# Patient Record
Sex: Male | Born: 1965 | Race: White | Hispanic: No | Marital: Married | Smoking: Current every day smoker
Health system: Southern US, Community
[De-identification: ages and names within clinical notes are randomized; demographics above are authoritative.]

## PROBLEM LIST (undated history)

## (undated) DIAGNOSIS — I10 Essential (primary) hypertension: Secondary | ICD-10-CM

## (undated) DIAGNOSIS — N200 Calculus of kidney: Secondary | ICD-10-CM

---

## 1998-06-19 ENCOUNTER — Encounter: Payer: Self-pay | Admitting: Emergency Medicine

## 1998-06-19 ENCOUNTER — Emergency Department (HOSPITAL_COMMUNITY): Admission: EM | Admit: 1998-06-19 | Discharge: 1998-06-19 | Payer: Self-pay | Admitting: Emergency Medicine

## 2005-09-21 ENCOUNTER — Emergency Department (HOSPITAL_COMMUNITY): Admission: EM | Admit: 2005-09-21 | Discharge: 2005-09-22 | Payer: Self-pay | Admitting: Emergency Medicine

## 2005-12-21 ENCOUNTER — Encounter: Admission: RE | Admit: 2005-12-21 | Discharge: 2005-12-21 | Payer: Self-pay | Admitting: Internal Medicine

## 2008-02-03 ENCOUNTER — Emergency Department (HOSPITAL_COMMUNITY): Admission: EM | Admit: 2008-02-03 | Discharge: 2008-02-03 | Payer: Self-pay | Admitting: Family Medicine

## 2010-10-12 ENCOUNTER — Encounter (HOSPITAL_COMMUNITY): Payer: Self-pay | Admitting: Radiology

## 2010-10-12 ENCOUNTER — Emergency Department (HOSPITAL_COMMUNITY): Payer: Self-pay

## 2010-10-12 ENCOUNTER — Emergency Department (HOSPITAL_COMMUNITY)
Admission: EM | Admit: 2010-10-12 | Discharge: 2010-10-12 | Disposition: A | Discharge status: Home / Self Care | Payer: Self-pay | Attending: Emergency Medicine | Admitting: Emergency Medicine

## 2010-10-12 DIAGNOSIS — R1013 Epigastric pain: Secondary | ICD-10-CM | POA: Insufficient documentation

## 2010-10-12 DIAGNOSIS — N1330 Unspecified hydronephrosis: Secondary | ICD-10-CM | POA: Insufficient documentation

## 2010-10-12 DIAGNOSIS — R109 Unspecified abdominal pain: Secondary | ICD-10-CM | POA: Insufficient documentation

## 2010-10-12 DIAGNOSIS — I10 Essential (primary) hypertension: Secondary | ICD-10-CM | POA: Insufficient documentation

## 2010-10-12 DIAGNOSIS — N201 Calculus of ureter: Secondary | ICD-10-CM | POA: Insufficient documentation

## 2010-10-12 DIAGNOSIS — R319 Hematuria, unspecified: Secondary | ICD-10-CM | POA: Insufficient documentation

## 2010-10-12 DIAGNOSIS — R300 Dysuria: Secondary | ICD-10-CM | POA: Insufficient documentation

## 2010-10-12 HISTORY — DX: Essential (primary) hypertension: I10

## 2010-10-12 LAB — URINE MICROSCOPIC-ADD ON

## 2010-10-12 LAB — URINALYSIS, ROUTINE W REFLEX MICROSCOPIC
Bilirubin Urine: NEGATIVE
Glucose, UA: NEGATIVE mg/dL
Ketones, ur: NEGATIVE mg/dL
Nitrite: NEGATIVE
Protein, ur: 30 mg/dL — AB
Specific Gravity, Urine: 1.023 (ref 1.005–1.030)
Urobilinogen, UA: 0.2 mg/dL (ref 0.0–1.0)
pH: 6 (ref 5.0–8.0)

## 2010-10-12 LAB — DIFFERENTIAL
Basophils Absolute: 0.1 10*3/uL (ref 0.0–0.1)
Eosinophils Absolute: 0.2 10*3/uL (ref 0.0–0.7)
Lymphocytes Relative: 26 % (ref 12–46)
Lymphs Abs: 3.2 10*3/uL (ref 0.7–4.0)
Neutrophils Relative %: 63 % (ref 43–77)

## 2010-10-12 LAB — CBC
HCT: 46.9 % (ref 39.0–52.0)
MCV: 89.3 fL (ref 78.0–100.0)
Platelets: 287 10*3/uL (ref 150–400)
RBC: 5.25 MIL/uL (ref 4.22–5.81)
RDW: 12.6 % (ref 11.5–15.5)
WBC: 12.2 10*3/uL — ABNORMAL HIGH (ref 4.0–10.5)

## 2010-10-12 LAB — COMPREHENSIVE METABOLIC PANEL
ALT: 24 U/L (ref 0–53)
AST: 20 U/L (ref 0–37)
Albumin: 4.2 g/dL (ref 3.5–5.2)
Alkaline Phosphatase: 93 U/L (ref 39–117)
BUN: 13 mg/dL (ref 6–23)
Chloride: 102 mEq/L (ref 96–112)
Potassium: 3.4 mEq/L — ABNORMAL LOW (ref 3.5–5.1)
Sodium: 140 mEq/L (ref 135–145)
Total Bilirubin: 0.4 mg/dL (ref 0.3–1.2)
Total Protein: 7.6 g/dL (ref 6.0–8.3)

## 2010-10-12 MED ORDER — IOHEXOL 300 MG/ML  SOLN
100.0000 mL | Freq: Once | INTRAMUSCULAR | Status: AC | PRN
  Administered 2010-10-12: 100 mL via INTRAVENOUS

## 2010-10-29 ENCOUNTER — Other Ambulatory Visit: Payer: Self-pay | Admitting: Urology

## 2010-10-29 ENCOUNTER — Encounter (HOSPITAL_COMMUNITY): Payer: BC Managed Care – PPO

## 2010-10-29 ENCOUNTER — Ambulatory Visit (HOSPITAL_COMMUNITY)
Admission: RE | Admit: 2010-10-29 | Discharge: 2010-10-29 | Disposition: A | Discharge status: Home / Self Care | Payer: BC Managed Care – PPO | Source: Ambulatory Visit | Attending: Urology | Admitting: Urology

## 2010-10-29 ENCOUNTER — Other Ambulatory Visit (HOSPITAL_COMMUNITY): Payer: Self-pay | Admitting: Urology

## 2010-10-29 DIAGNOSIS — Z01810 Encounter for preprocedural cardiovascular examination: Secondary | ICD-10-CM | POA: Insufficient documentation

## 2010-10-29 DIAGNOSIS — N200 Calculus of kidney: Secondary | ICD-10-CM | POA: Insufficient documentation

## 2010-10-29 DIAGNOSIS — I10 Essential (primary) hypertension: Secondary | ICD-10-CM

## 2010-10-29 DIAGNOSIS — Z01811 Encounter for preprocedural respiratory examination: Secondary | ICD-10-CM

## 2010-10-29 DIAGNOSIS — Z87891 Personal history of nicotine dependence: Secondary | ICD-10-CM | POA: Insufficient documentation

## 2010-10-29 DIAGNOSIS — Z01818 Encounter for other preprocedural examination: Secondary | ICD-10-CM | POA: Insufficient documentation

## 2010-10-29 DIAGNOSIS — Z01812 Encounter for preprocedural laboratory examination: Secondary | ICD-10-CM | POA: Insufficient documentation

## 2010-10-29 LAB — BASIC METABOLIC PANEL
BUN: 17 mg/dL (ref 6–23)
CO2: 31 mEq/L (ref 19–32)
Calcium: 10.1 mg/dL (ref 8.4–10.5)
Creatinine, Ser: 0.89 mg/dL (ref 0.4–1.5)
GFR calc non Af Amer: >60 mL/min (ref >60)
Glucose, Bld: 102 mg/dL — ABNORMAL HIGH (ref 70–99)
Sodium: 138 mEq/L (ref 135–145)

## 2010-10-29 LAB — SURGICAL PCR SCREEN: Staphylococcus aureus: INVALID — AB

## 2010-11-01 ENCOUNTER — Ambulatory Visit (HOSPITAL_COMMUNITY)
Admission: RE | Admit: 2010-11-01 | Discharge: 2010-11-01 | Disposition: A | Discharge status: Home / Self Care | Payer: BC Managed Care – PPO | Source: Ambulatory Visit | Attending: Urology | Admitting: Urology

## 2010-11-01 DIAGNOSIS — Z79899 Other long term (current) drug therapy: Secondary | ICD-10-CM | POA: Insufficient documentation

## 2010-11-01 DIAGNOSIS — N201 Calculus of ureter: Secondary | ICD-10-CM | POA: Insufficient documentation

## 2010-11-01 DIAGNOSIS — I10 Essential (primary) hypertension: Secondary | ICD-10-CM | POA: Insufficient documentation

## 2010-11-01 DIAGNOSIS — G510 Bell's palsy: Secondary | ICD-10-CM | POA: Insufficient documentation

## 2010-11-01 LAB — MRSA CULTURE

## 2010-11-08 ENCOUNTER — Other Ambulatory Visit: Payer: Self-pay | Admitting: Urology

## 2010-11-08 ENCOUNTER — Encounter (HOSPITAL_COMMUNITY): Payer: BC Managed Care – PPO

## 2010-11-08 LAB — BASIC METABOLIC PANEL
CO2: 29 mEq/L (ref 19–32)
Chloride: 100 mEq/L (ref 96–112)
Creatinine, Ser: 0.98 mg/dL (ref 0.50–1.35)
GFR calc Af Amer: >60 mL/min (ref >60)
Potassium: 4.3 mEq/L (ref 3.5–5.1)
Sodium: 139 mEq/L (ref 135–145)

## 2010-11-11 ENCOUNTER — Ambulatory Visit (HOSPITAL_COMMUNITY)
Admission: RE | Admit: 2010-11-11 | Discharge: 2010-11-11 | Disposition: A | Discharge status: Home / Self Care | Payer: BC Managed Care – PPO | Source: Ambulatory Visit | Attending: Urology | Admitting: Urology

## 2010-11-11 DIAGNOSIS — F17200 Nicotine dependence, unspecified, uncomplicated: Secondary | ICD-10-CM | POA: Insufficient documentation

## 2010-11-11 DIAGNOSIS — Z01812 Encounter for preprocedural laboratory examination: Secondary | ICD-10-CM | POA: Insufficient documentation

## 2010-11-11 DIAGNOSIS — I10 Essential (primary) hypertension: Secondary | ICD-10-CM | POA: Insufficient documentation

## 2010-11-11 DIAGNOSIS — N201 Calculus of ureter: Secondary | ICD-10-CM | POA: Insufficient documentation

## 2010-11-11 NOTE — Op Note (Signed)
NAMEMarland Tran  Tran, Richard NO.:  [id number]  MEDICAL RECORD NO.:  [id number]  LOCATION:  DAYL                         FACILITY:  [facility]  PHYSICIAN:  Heloise Purpura, MD      DATE OF BIRTH:  10-20-1965  DATE OF PROCEDURE:  11/01/2010 DATE OF DISCHARGE:  11/01/2010                              OPERATIVE REPORT   PREOPERATIVE DIAGNOSIS:  Right ureteral calculus.  POSTOPERATIVE DIAGNOSIS:  Right ureteral calculus.  PROCEDURES: 1. Cystoscopy. 2. Dilation of urethra. 3. Right retrograde pyelography. 4. Right ureteroscopy. 5. Right ureteral stent placement (6 x 24).  SURGEON:  Heloise Purpura, MD  ANESTHESIA:  General.  COMPLICATIONS:  None.  ESTIMATED BLOOD LOSS:  Minimal.  INDICATIONS:  Mr. [name] is a 45 year old gentleman who was found to have a proximal right ureteral calculus.  After a trial of medical expulsion therapy which was unsuccessful, it was recommended that he undergo definitive surgical treatment of his obstructing right ureteropelvic junction calculus.  His stone was poorly visualized on plain radiograph imaging, thereby making extracorporeal shock wave lithotripsy a poor treatment choice.  We therefore discussed other options and he elected to proceed with ureteroscopic removal and possible laser lithotripsy.  The potential risks, complications, and alternative treatment options of this plan were discussed with the patient and informed consent obtained.  DESCRIPTION OF PROCEDURE:  The patient was taken to the operating room and a general anesthetic was administered.  He was given preoperative antibiotics, placed in the dorsal lithotomy position, and prepped and draped in the usual sterile fashion.  Next a preoperative time-out was performed.  Cystourethroscopy was then attempted but the patient was noted to have narrowing of his distal urethra related to the distal shaft hypospadias.  Sissy Hoff sounds were used to dilate his urethra from 12  to 37 [place].  A 22-French cystoscope sheath was then able to be passed without difficulty and the urethra was examined under direct vision and otherwise appeared unremarkable.  The bladder was then systematically examined with no bladder tumors, stones, or other mucosal pathology identified.  The ureteral orifices were located in their normal expected anatomic locations.  The right ureteral orifice was then cannulated with a 6-French ureteral catheter and Omnipaque contrast was injected.  This demonstrated no obvious filling defects within the distal ureter and a normal caliber ureter up to the proximal ureter.  To ensure no distal stone prior to proceeding with flexible ureteroscopy, a 0.038 sensor guidewire was placed and a semi-rigid 6-French ureteroscope was advanced next to the wire into the distal ureter.  The ureter was able to be examined up to and just above the level of the iliac vessels with no stone identified.  It was felt at this point that the patient's stone was still probably located at the ureteropelvic junction.  An attempt was made to pass a 12/14 ureteral access sheath over the guide wire into the proximal ureter below the suspected level of the stone. However, the access sheath could not be passed easily past level of the iliac vessels.  Therefore the access sheath was removed after a second 0.038 glidewire was passed up into the renal pelvis again under fluoroscopic guidance.  The  7-French Storz flexible ureteroscope was then passed over the glidewire but was unable to be passed up further than the mid proximal ureter.  After multiple attempts, it was decided to place a ureteral stent for passive ureteral dilation.  Therefore the flexible ureteroscope was removed and the wire was back loaded on the cystoscope.  A 6 x 24 double-J ureteral stent was then advanced over the wire using Seldinger technique and positioned appropriately under fluoroscopic and cystoscopic  guidance.  The wire was removed with good curl noted in the renal pelvis as well as in the bladder.  The patient's bladder was then emptied.  He tolerated the procedure well and without complications.  He was able to be extubated and transferred to recovery unit in satisfactory condition.     Heloise Purpura, MD     LB/MEDQ  D:  11/01/2010  T:  11/01/2010  Job:  [id number]  Electronically Signed by Heloise Purpura MD on 11/11/2010 11:33:47 PM

## 2010-11-11 NOTE — Op Note (Signed)
NAME:  MARSEL, [name] NO.:  [id number]  MEDICAL RECORD NO.:  [id number]  LOCATION:  DAYL                         FACILITY:  [facility]  PHYSICIAN:  Heloise Purpura, MD      DATE OF BIRTH:  [birth year]  DATE OF PROCEDURE:  11/11/2010 DATE OF DISCHARGE:                              OPERATIVE REPORT   PREOPERATIVE DIAGNOSIS:  Right ureteral calculus.  POSTOPERATIVE DIAGNOSIS:  Right ureteral calculus.  PROCEDURES: 1. Cystoscopy. 2. Right ureteroscopy with stone removal. 3. Right ureteral stent placement.  SURGEON:  Heloise Purpura, MD  ANESTHESIA:  General.  COMPLICATIONS:  None.  INDICATIONS:  [name] is a 45 year old gentleman who presented with the right ureteral calculus and failed a trial of medical expulsion therapy.  He then underwent attempted ureteroscopic removal but his stone was unable to be removed at that time due to an inability to place the flexible ureteroscope through the ureter due to ureteral size. Therefore, ureteral stent was left indwelling for passive ureteral dilation.  He presents today for further evaluation and definitive treatment of the stone.  The potential risks, complications, and alternative treatment options have been discussed in detail and informed consent obtained.  DESCRIPTION OF PROCEDURE:  The patient was taken to the operating room and a general anesthetic was administered.  He was given preoperative antibiotics, placed in the dorsal lithotomy position, and prepped and draped in the usual sterile fashion.  Next preoperative time-out was performed.  Cystourethroscopy was then attempted to be performed, however, the patient was noted to have a hypospadiac meatus distally. He therefore underwent urethral dilation with Sissy Hoff sounds from 18- [place] up to 24-French.  Cystourethroscopy was then performed which revealed a normal anterior and posterior urethra.  Inspection of the bladder revealed an indwelling right  ureteral stent which was brought out through the urethral meatus with the aid of the flexible graspers. A 0.038 sensor guidewire was then advanced up the stent into the right renal pelvis under fluoroscopic guidance.  The stent was then removed. A semirigid ureteroscope was then advanced next to the guidewire in the distal half of the ureter with no stone seen.  It was therefore felt that it would be safe to place a ureteral access sheath up to this point.  A 12/14 ACMI sheath was then placed over the wire up into the mid proximal ureter which had already been examined and found to be stone free.  Using the digital flexible ureteroscope, this was then passed through the access sheath and into the proximal ureter.  The patient's stone was identified and was able to be removed with a nitinol basket.  Further inspection of the proximal ureter and the renal collecting system revealed no further stones.  A 0.38 sensor guidewire was then advanced back to the ureteroscope into the right renal pelvis and the ureteroscope and access sheath were removed.  The guidewire was then back loaded on the cystoscope and a 6 x 24 double-J ureteral stent with a string tether was placed over the wire and positioned appropriately under fluoroscopic and cystoscopic guidance.  The wire was removed with a good curl noted in the renal pelvis as well as in the  bladder.  The patient's bladder was emptied. He tolerated the procedure well and without complications.  He was able to be awakened and transferred to recovery unit in satisfactory condition.     Heloise Purpura, MD     LB/MEDQ  D:  11/11/2010  T:  11/11/2010  Job:  [id number]  Electronically Signed by Heloise Purpura MD on 11/11/2010 11:33:56 PM

## 2011-02-23 LAB — POCT URINALYSIS DIP (DEVICE)
Glucose, UA: NEGATIVE
Nitrite: NEGATIVE
Urobilinogen, UA: 0.2

## 2011-02-23 LAB — GC/CHLAMYDIA PROBE AMP, GENITAL
Chlamydia, DNA Probe: NEGATIVE
GC Probe Amp, Genital: NEGATIVE

## 2012-06-20 ENCOUNTER — Encounter (HOSPITAL_COMMUNITY): Payer: Self-pay | Admitting: Emergency Medicine

## 2012-06-20 ENCOUNTER — Other Ambulatory Visit: Payer: Self-pay

## 2012-06-20 ENCOUNTER — Emergency Department (HOSPITAL_COMMUNITY): Payer: Self-pay

## 2012-06-20 ENCOUNTER — Emergency Department (HOSPITAL_COMMUNITY)
Admission: EM | Admit: 2012-06-20 | Discharge: 2012-06-20 | Disposition: A | Discharge status: Home / Self Care | Payer: Self-pay | Attending: Emergency Medicine | Admitting: Emergency Medicine

## 2012-06-20 DIAGNOSIS — R509 Fever, unspecified: Secondary | ICD-10-CM | POA: Insufficient documentation

## 2012-06-20 DIAGNOSIS — R05 Cough: Secondary | ICD-10-CM | POA: Insufficient documentation

## 2012-06-20 DIAGNOSIS — Z87442 Personal history of urinary calculi: Secondary | ICD-10-CM | POA: Insufficient documentation

## 2012-06-20 DIAGNOSIS — R52 Pain, unspecified: Secondary | ICD-10-CM | POA: Insufficient documentation

## 2012-06-20 DIAGNOSIS — R5381 Other malaise: Secondary | ICD-10-CM | POA: Insufficient documentation

## 2012-06-20 DIAGNOSIS — R6889 Other general symptoms and signs: Secondary | ICD-10-CM | POA: Insufficient documentation

## 2012-06-20 DIAGNOSIS — R5383 Other fatigue: Secondary | ICD-10-CM | POA: Insufficient documentation

## 2012-06-20 DIAGNOSIS — J159 Unspecified bacterial pneumonia: Secondary | ICD-10-CM | POA: Insufficient documentation

## 2012-06-20 DIAGNOSIS — F17200 Nicotine dependence, unspecified, uncomplicated: Secondary | ICD-10-CM | POA: Insufficient documentation

## 2012-06-20 DIAGNOSIS — I10 Essential (primary) hypertension: Secondary | ICD-10-CM | POA: Insufficient documentation

## 2012-06-20 DIAGNOSIS — R059 Cough, unspecified: Secondary | ICD-10-CM | POA: Insufficient documentation

## 2012-06-20 DIAGNOSIS — J3489 Other specified disorders of nose and nasal sinuses: Secondary | ICD-10-CM | POA: Insufficient documentation

## 2012-06-20 DIAGNOSIS — J189 Pneumonia, unspecified organism: Secondary | ICD-10-CM

## 2012-06-20 HISTORY — DX: Calculus of kidney: N20.0

## 2012-06-20 MED ORDER — ALBUTEROL SULFATE HFA 108 (90 BASE) MCG/ACT IN AERS
2.0000 | INHALATION_SPRAY | RESPIRATORY_TRACT | Status: AC
  Administered 2012-06-20: 2 via RESPIRATORY_TRACT
  Filled 2012-06-20: qty 6.7

## 2012-06-20 MED ORDER — ACETAMINOPHEN 500 MG PO TABS
1000.0000 mg | ORAL_TABLET | Freq: Once | ORAL | Status: AC
  Administered 2012-06-20: 1000 mg via ORAL
  Filled 2012-06-20: qty 2

## 2012-06-20 MED ORDER — AZITHROMYCIN 250 MG PO TABS: 250.0000 mg | ORAL_TABLET | Freq: Every day | ORAL | Status: AC

## 2012-06-20 MED ORDER — AZITHROMYCIN 250 MG PO TABS
500.0000 mg | ORAL_TABLET | Freq: Once | ORAL | Status: AC
  Administered 2012-06-20: 500 mg via ORAL
  Filled 2012-06-20: qty 2

## 2012-06-20 MED ORDER — IBUPROFEN 200 MG PO TABS
400.0000 mg | ORAL_TABLET | Freq: Once | ORAL | Status: AC
  Administered 2012-06-20: 400 mg via ORAL
  Filled 2012-06-20: qty 2

## 2012-06-20 NOTE — ED Notes (Signed)
Pt c/o of chest pain, fever, and chills that started yesterday afternoon.

## 2012-06-20 NOTE — ED Provider Notes (Signed)
History     CSN: [id number]  Arrival date & time 06/20/12  1457   First MD Initiated Contact with Patient 06/20/12 1514      Chief Complaint  Patient presents with  . Fever  . Chills  . Cough    HPI Pt was seen at 1525.    Per pt, c/o gradual onset and persistence of constant runny/stuffy nose, sinus congestion, home fevers to "103," chills, generalized body aches/fatigue, and cough since this morning.  States he did not take tylenol or motrin PTA. Denies rash, no CP/SOB, no N/V/D, no abd pain.     Past Medical History  Diagnosis Date  . Hypertension   . Kidney stone     History reviewed. No pertinent past surgical history.   History  Substance Use Topics  . Smoking status: Current Every Day Smoker  . Smokeless tobacco: Not on file  . Alcohol Use: No      Review of Systems ROS: Statement: All systems negative except as marked or noted in the HPI; Constitutional: +fever and chills, generalized body aches/fatigue. ; ; Eyes: Negative for eye pain, redness and discharge. ; ; ENMT: Negative for ear pain, hoarseness, sore throat. +nasal congestion, sinus pressure. ; ; Cardiovascular: Negative for chest pain, palpitations, diaphoresis, dyspnea and peripheral edema. ; ; Respiratory: +cough. Negative for wheezing and stridor. ; ; Gastrointestinal: Negative for nausea, vomiting, diarrhea, abdominal pain, blood in stool, hematemesis, jaundice and rectal bleeding. . ; ; Genitourinary: Negative for dysuria, flank pain and hematuria. ; ; Musculoskeletal: Negative for back pain and neck pain. Negative for swelling and trauma.; ; Skin: Negative for pruritus, rash, abrasions, blisters, bruising and skin lesion.; ; Neuro: Negative for headache, lightheadedness and neck stiffness. Negative for altered level of consciousness , altered mental status, extremity weakness, paresthesias, involuntary movement, seizure and syncope.      Allergies  Review of patient's allergies indicates no known  allergies.  Home Medications   Current Outpatient Rx  Name  Route  Sig  Dispense  Refill  . DEXTROMETHORPHAN POLISTIREX ER 30 MG/5ML PO LQCR   Oral   Take 60 mg by mouth 2 (two) times daily as needed. For cough.           BP 137/81  Temp 101 F (38.3 C) (Oral)  Resp 18  SpO2 96%  Physical Exam 1530: Physical examination:  Nursing notes reviewed; Vital signs and O2 SAT reviewed;  Constitutional: Well developed, Well nourished, Well hydrated, In no acute distress; Head:  Normocephalic, atraumatic; Eyes: EOMI, PERRL, No scleral icterus; ENMT: TM's clear bilat. +edemetous nasal turbinates bilat with clear rhinorrhea.  Mouth and pharynx without lesions. No tonsillar exudates. No intra-oral edema. No hoarse voice, no drooling, no stridor. No pain with manipulation of larynx. Mouth and pharynx normal, Mucous membranes moist; Neck: Supple, Full range of motion, No lymphadenopathy; Cardiovascular: Tachycardic rate and rhythm, No murmur, rub, or gallop; Respiratory: Breath sounds clear & equal bilaterally, No rales, rhonchi, wheezes.  Speaking full sentences with ease, Normal respiratory effort/excursion; Chest: Nontender, Movement normal; Abdomen: Soft, Nontender, Nondistended, Normal bowel sounds;; Extremities: Pulses normal, No tenderness, No edema, No calf edema or asymmetry.; Neuro: AA&Ox3, Major CN grossly intact.  Speech clear. No gross focal motor or sensory deficits in extremities.; Skin: Color normal, Warm, Dry.   ED Course  Procedures    MDM  MDM Reviewed: nursing note and vitals Reviewed previous: ECG Interpretation: x-ray and ECG   Dg Chest 2 View 06/20/2012  *RADIOLOGY REPORT*  Clinical Data: Cough.  Infiltrate.  CHF.  Free air.  CHEST - 2 VIEW  Comparison: 10/29/2010, 09/21/2005.  Findings: There is diffuse prominence of the interstitium, which is new compared to prior chest radiograph 10/29/2010 and 09/21/2005. In the setting of cough, this can be associated with atypical  infection/mycoplasma.  The cardiopericardial silhouette appears within normal limits.  There is no pulmonary edema.  No airspace consolidation.  No pleural effusion.  Unchanged thoracic kyphosis. Monitoring leads are projected over the chest.  IMPRESSION: Diffuse interstitial pattern is new compared to prior, potentially associated with mycoplasma pneumonia.   Original Report Authenticated By: Andreas Newport, M.D.      Date: 06/20/2012  Rate: 105  Rhythm: sinus tachycardia  QRS Axis: normal  Intervals: normal  ST/T Wave abnormalities: normal  Conduction Disutrbances:none  Narrative Interpretation:   Old EKG Reviewed: unchanged; no significant changes from previous EKG dated 10/29/2010.    1715:  Feels improved after tylenol/motrin. Sats remain 97% R/A, resps easy, A&O, NAD. Will give 1st dose abx in ED for CAP.  Encouraged to stop smoking.  Wants to go home now.  Dx and testing d/w pt and family.  Questions answered.  Verb understanding, agreeable to d/c home with outpt f/u.           Laray Anger, DO 06/22/12 1427

## 2014-12-26 ENCOUNTER — Ambulatory Visit: Payer: BLUE CROSS/BLUE SHIELD | Attending: Internal Medicine

## 2014-12-26 DIAGNOSIS — G4733 Obstructive sleep apnea (adult) (pediatric): Secondary | ICD-10-CM | POA: Diagnosis present

## 2015-01-23 ENCOUNTER — Ambulatory Visit: Payer: BLUE CROSS/BLUE SHIELD | Attending: Internal Medicine

## 2015-01-23 DIAGNOSIS — G4733 Obstructive sleep apnea (adult) (pediatric): Secondary | ICD-10-CM | POA: Insufficient documentation

## 2019-07-06 ENCOUNTER — Ambulatory Visit: Payer: BLUE CROSS/BLUE SHIELD

## 2019-08-15 ENCOUNTER — Ambulatory Visit: Payer: Managed Care, Other (non HMO) | Attending: Internal Medicine

## 2019-08-15 DIAGNOSIS — Z23 Encounter for immunization: Secondary | ICD-10-CM

## 2019-08-15 NOTE — Progress Notes (Signed)
   Covid-19 Vaccination Clinic  Name:  [name]    MRN: [id number] DOB: Oct 04, 1965  08/15/2019  [name] was observed post Covid-19 immunization for 15 minutes without incident. He was provided with Vaccine Information Sheet and instruction to access the V-Safe system.   [name] was instructed to call 911 with any severe reactions post vaccine: [name] Difficulty breathing  . Swelling of face and throat  . A fast heartbeat  . A bad rash all over body  . Dizziness and weakness   Immunizations Administered    Name Date Dose VIS Date Route   Pfizer COVID-19 Vaccine 08/15/2019 12:28 PM 0.3 mL 05/03/2019 Intramuscular   Manufacturer: [employer], [employer]   Lot: [id number]   NDC: [address]-2

## 2019-08-16 ENCOUNTER — Ambulatory Visit: Payer: Managed Care, Other (non HMO)

## 2019-09-10 ENCOUNTER — Ambulatory Visit: Payer: Managed Care, Other (non HMO) | Attending: Internal Medicine

## 2019-09-10 DIAGNOSIS — Z23 Encounter for immunization: Secondary | ICD-10-CM

## 2019-09-10 NOTE — Progress Notes (Signed)
   Covid-19 Vaccination Clinic  Name:  [name]    MRN: [id number] DOB: Oct 07, 1965  09/10/2019  [name] was observed post Covid-19 immunization for 15 minutes without incident. He was provided with Vaccine Information Sheet and instruction to access the V-Safe system.   [name] was instructed to call 911 with any severe reactions post vaccine: [name] Difficulty breathing  . Swelling of face and throat  . A fast heartbeat  . A bad rash all over body  . Dizziness and weakness   Immunizations Administered    Name Date Dose VIS Date Route   Pfizer COVID-19 Vaccine 09/10/2019 12:00 PM 0.3 mL 07/17/2018 Intramuscular   Manufacturer: [employer], [employer]   Lot: [id number]   NDC: [address]-6

## 2020-01-10 ENCOUNTER — Encounter: Payer: Self-pay | Admitting: Urology

## 2020-01-10 ENCOUNTER — Other Ambulatory Visit: Payer: Self-pay

## 2020-01-10 ENCOUNTER — Ambulatory Visit (INDEPENDENT_AMBULATORY_CARE_PROVIDER_SITE_OTHER): Payer: Managed Care, Other (non HMO) | Admitting: Urology

## 2020-01-10 VITALS — BP 122/73 | HR 82 | Ht 70.0 in | Wt 238.0 lb

## 2020-01-10 DIAGNOSIS — R300 Dysuria: Secondary | ICD-10-CM

## 2020-01-10 DIAGNOSIS — N410 Acute prostatitis: Secondary | ICD-10-CM | POA: Diagnosis not present

## 2020-01-10 MED ORDER — TAMSULOSIN HCL 0.4 MG PO CAPS: 0.4000 mg | ORAL_CAPSULE | Freq: Every day | ORAL | 0 refills | Status: DC

## 2020-01-10 NOTE — Progress Notes (Signed)
01/10/2020 11:40 AM   [name] 27-Jan-1966 [id number]  Referring provider: Bobby Rumpf, MD [address] [place],  [place] [address]  Chief Complaint  Patient presents with  . Dysuria    HPI: Leary Mcnulty is a 54 y.o. male referred at the request of Dr. Wallene Huh for evaluation of UTI/prostatitis.   Seen [facility] 01/04/2020 with a 3-4-day history of dysuria, frequency, urgency, perineal/rectal pain and suprapubic pressure  No fever or chills  + Nausea/malaise  UA with pyuria/microhematuria  Urine culture grew >100,000 Klebsiella pneumoniae resistant to amoxicillin  Given a 14-day course of ciprofloxacin  8/19 began to feel normal  Past urologic history remarkable for right ureteroscopic stone removal 10/2010  States it took him 3 years to recover from that procedure and he has never felt right since  Intermittent mild dysuria, postvoid dribbling  2-year history recurrent balanitis   PMH: Past Medical History:  Diagnosis Date  . Hypertension   . Kidney stone     Surgical History: History reviewed. No pertinent surgical history.  Home Medications:  Allergies as of 01/10/2020   No Known Allergies     Medication List       Accurate as of January 10, 2020 11:40 AM. If you have any questions, ask your nurse or doctor.        amLODipine 10 MG tablet Commonly known as: NORVASC Take 1 tablet by mouth daily.   atorvastatin 10 MG tablet Commonly known as: LIPITOR Take 1 tablet by mouth daily.   azithromycin 250 MG tablet Commonly known as: ZITHROMAX Take 1 tablet (250 mg total) by mouth daily. x4 days (start 06/21/12)   ciprofloxacin 500 MG tablet Commonly known as: CIPRO Take by mouth.   dextromethorphan 30 MG/5ML liquid Commonly known as: DELSYM Take 60 mg by mouth 2 (two) times daily as needed. For cough.   EpiPen 2-Pak 0.3 mg/0.3 mL Soaj injection Generic drug: EPINEPHrine See admin instructions.   lisinopril 40 MG  tablet Commonly known as: ZESTRIL Take 1 tablet by mouth daily.       Allergies: No Known Allergies  Family History: History reviewed. No pertinent family history.  Social History:  reports that he has been smoking. He has never used smokeless tobacco. He reports that he does not drink alcohol and does not use drugs.   Physical Exam: BP 122/73   Pulse 82   Ht 5\' 10"  (1.778 m)   Wt 238 lb (108 kg)   BMI 34.15 kg/m   Constitutional:  Alert and oriented, No acute distress. HEENT: [place] AT, moist mucus membranes.  Trachea midline, no masses. Cardiovascular: No clubbing, cyanosis, or edema. Respiratory: Normal respiratory effort, no increased work of breathing. GI: Abdomen is soft, nontender, nondistended, no abdominal masses GU: Phallus uncircumcised without lesions, foreskin easily retracts; meatus/glans normal.  Testes descended bilaterally without masses or tenderness; spermatic cord/epididymis palpably normal bilaterally.  Prostate 40 g, smooth without nodules. Lymph: No cervical or inguinal lymphadenopathy. Skin: No rashes, bruises or suspicious lesions. Neurologic: Grossly intact, no focal deficits, moving all 4 extremities. Psychiatric: Normal mood and affect.   Assessment & Plan:    1.  Urinary tract infection  Most likely bacterial prostatitis  Complete current antibiotic course  Lab visit for repeat urinalysis 2 weeks  KUB/RUS  Trial tamsulosin 0.4 mg daily  Follow-up 1 month for symptom reassessment and review of imaging studies  Cystoscopy for persistent symptoms   , MD  [facility]  [address], [place], [place] [address] [phone]

## 2020-01-11 ENCOUNTER — Encounter: Payer: Self-pay | Admitting: Urology

## 2020-01-11 ENCOUNTER — Other Ambulatory Visit: Payer: Self-pay | Admitting: Urology

## 2020-01-11 DIAGNOSIS — Z87442 Personal history of urinary calculi: Secondary | ICD-10-CM

## 2020-01-13 LAB — URINALYSIS, COMPLETE
Bilirubin, UA: NEGATIVE
Glucose, UA: NEGATIVE
Ketones, UA: NEGATIVE
Leukocytes,UA: NEGATIVE
Nitrite, UA: NEGATIVE
Protein,UA: NEGATIVE
Specific Gravity, UA: >1.03 — ABNORMAL HIGH (ref 1.005–1.030)
Urobilinogen, Ur: 0.2 mg/dL (ref 0.2–1.0)
pH, UA: 5 (ref 5.0–7.5)

## 2020-01-13 LAB — MICROSCOPIC EXAMINATION: Bacteria, UA: NONE SEEN

## 2020-01-23 ENCOUNTER — Other Ambulatory Visit: Payer: Self-pay

## 2020-01-23 DIAGNOSIS — Z87442 Personal history of urinary calculi: Secondary | ICD-10-CM

## 2020-01-24 ENCOUNTER — Other Ambulatory Visit: Payer: Managed Care, Other (non HMO)

## 2020-01-24 ENCOUNTER — Other Ambulatory Visit: Payer: Self-pay

## 2020-01-24 ENCOUNTER — Telehealth: Payer: Self-pay | Admitting: Urology

## 2020-01-24 DIAGNOSIS — Z87442 Personal history of urinary calculi: Secondary | ICD-10-CM

## 2020-01-24 LAB — URINALYSIS, COMPLETE
Bilirubin, UA: NEGATIVE
Glucose, UA: NEGATIVE
Ketones, UA: NEGATIVE
Leukocytes,UA: NEGATIVE
Nitrite, UA: NEGATIVE
Protein,UA: NEGATIVE
Specific Gravity, UA: >1.03 — ABNORMAL HIGH (ref 1.005–1.030)
Urobilinogen, Ur: 0.2 mg/dL (ref 0.2–1.0)
pH, UA: 5.5 (ref 5.0–7.5)

## 2020-01-24 LAB — MICROSCOPIC EXAMINATION
Bacteria, UA: NONE SEEN
Epithelial Cells (non renal): NONE SEEN /hpf (ref 0–10)

## 2020-01-24 NOTE — Telephone Encounter (Signed)
Pt wanted to let you know that he quit taking Tamsulosin.  It made him dizzy, light headed and sick to his stomach.

## 2020-02-06 ENCOUNTER — Other Ambulatory Visit: Payer: Self-pay | Admitting: Urology

## 2020-02-13 ENCOUNTER — Other Ambulatory Visit: Payer: Self-pay | Admitting: *Deleted

## 2020-02-13 ENCOUNTER — Ambulatory Visit: Payer: Self-pay | Admitting: Urology

## 2020-02-14 ENCOUNTER — Ambulatory Visit: Payer: Self-pay | Admitting: Urology

## 2020-02-27 ENCOUNTER — Other Ambulatory Visit: Payer: Self-pay

## 2020-02-27 ENCOUNTER — Ambulatory Visit
Admission: RE | Admit: 2020-02-27 | Discharge: 2020-02-27 | Disposition: A | Discharge status: Home / Self Care | Payer: Managed Care, Other (non HMO) | Source: Ambulatory Visit | Attending: Urology | Admitting: Urology

## 2020-02-27 DIAGNOSIS — Z87442 Personal history of urinary calculi: Secondary | ICD-10-CM | POA: Insufficient documentation

## 2020-03-02 ENCOUNTER — Other Ambulatory Visit: Payer: Self-pay | Admitting: Urology

## 2020-03-05 ENCOUNTER — Telehealth: Payer: Self-pay | Admitting: Family Medicine

## 2020-03-05 NOTE — Telephone Encounter (Signed)
Patient notified and voiced understanding.

## 2020-03-05 NOTE — Telephone Encounter (Signed)
-----   Message from Riki Altes, MD sent at 03/05/2020 12:17 PM EDT ----- Renal ultrasound showed no stones, obstruction or anatomic abnormalities

## 2021-03-31 ENCOUNTER — Other Ambulatory Visit: Payer: Self-pay

## 2021-03-31 DIAGNOSIS — Z87891 Personal history of nicotine dependence: Secondary | ICD-10-CM

## 2021-03-31 DIAGNOSIS — F17210 Nicotine dependence, cigarettes, uncomplicated: Secondary | ICD-10-CM

## 2021-04-26 ENCOUNTER — Other Ambulatory Visit: Payer: Self-pay

## 2021-04-26 ENCOUNTER — Telehealth (INDEPENDENT_AMBULATORY_CARE_PROVIDER_SITE_OTHER): Payer: Managed Care, Other (non HMO) | Admitting: Acute Care

## 2021-04-26 ENCOUNTER — Encounter: Payer: Self-pay | Admitting: Acute Care

## 2021-04-26 DIAGNOSIS — F17210 Nicotine dependence, cigarettes, uncomplicated: Secondary | ICD-10-CM | POA: Diagnosis not present

## 2021-04-26 NOTE — Patient Instructions (Signed)
Thank you for participating in the [facility]. °It was our pleasure to meet you today. °We will call you with the results of your scan within the next few days. °Your scan will be assigned a Lung RADS category score by the physicians reading the scans.  °This Lung RADS score determines follow up scanning.  °See below for description of categories, and follow up screening recommendations. °We will be in touch to schedule your follow up screening annually or based on recommendations of our providers. °We will fax a copy of your scan results to your Primary Care Physician, or the physician who referred you to the program, to ensure they have the results. °Please call the office if you have any questions or concerns regarding your scanning experience or results.  °Our office number is [phone]. °Please speak with Denise Phelps, RN. She is our Lung Cancer Screening RN. °If she is unavailable when you call, please have the office staff send her a message. She will return your call at her earliest convenience. °Remember, if your scan is normal, we will scan you annually as long as you continue to meet the criteria for the program. (Age 55-77, Current smoker or smoker who has quit within the last 15 years). °If you are a smoker, remember, quitting is the single most powerful action that you can take to decrease your risk of lung cancer and other pulmonary, breathing related problems. °We know quitting is hard, and we are here to help.  °Please let us know if there is anything we can do to help you meet your goal of quitting. °If you are a former smoker, congratulations. We are proud of you! Remain smoke free! °Remember you can refer friends or family members through the number above.  °We will screen them to make sure they meet criteria for the program. °Thank you for helping us take better care of you by participating in Lung Screening. ° °You can receive free nicotine replacement therapy  ( patches, gum or mints) by calling 1-800-QUIT NOW. Please call so we can get you on the path to becoming  a non-smoker. I know it is hard, but you can do this! ° °Lung RADS Categories: ° °Lung RADS 1: no nodules or definitely non-concerning nodules.  °Recommendation is for a repeat annual scan in 12 months. ° °Lung RADS 2:  nodules that are non-concerning in appearance and behavior with a very low likelihood of becoming an active cancer. °Recommendation is for a repeat annual scan in 12 months. ° °Lung RADS 3: nodules that are probably non-concerning , includes nodules with a low likelihood of becoming an active cancer.  Recommendation is for a 6-month repeat screening scan. Often noted after an upper respiratory illness. We will be in touch to make sure you have no questions, and to schedule your 6-month scan. ° °Lung RADS 4 A: nodules with concerning findings, recommendation is most often for a follow up scan in 3 months or additional testing based on our provider's assessment of the scan. We will be in touch to make sure you have no questions and to schedule the recommended 3 month follow up scan. ° °Lung RADS 4 B:  indicates findings that are concerning. We will be in touch with you to schedule additional diagnostic testing based on our provider's  assessment of the scan. ° °Hypnosis for smoking cessation  °[employer] °[phone] ° °Acupuncture for smoking cessation  °[facility] °[phone]  °

## 2021-04-26 NOTE — Progress Notes (Signed)
Virtual Visit via Video Note  I connected with [address]ONTAE [address] on 04/26/21 at  4:00 PM EST by a video enabled telemedicine application and verified that I am speaking with the correct person using two identifiers.  Location: Patient: At home Provider:  [address] [place], [place], [address]    I discussed the limitations of evaluation and management by telemedicine and the availability of in person appointments. The patient expressed understanding and agreed to proceed.  Shared Decision Making Visit Lung Cancer Screening Program [phone])   Eligibility: Age 55 y.o. Pack Years Smoking History Calculation 38 pack year smoking history (# packs/per year x # years smoked) Recent History of coughing up blood  no Unexplained weight loss? no ( >Than 15 pounds within the last 6 months ) Prior History Lung / other cancer no (Diagnosis within the last 5 years already requiring surveillance chest CT Scans). Smoking Status Current Smoker Former Smokers: Years since quit: NA  Quit Date: NA  Visit Components: Discussion included one or more decision making aids. yes Discussion included risk/benefits of screening. yes Discussion included potential follow up diagnostic testing for abnormal scans. yes Discussion included meaning and risk of over diagnosis. yes Discussion included meaning and risk of False Positives. yes Discussion included meaning of total radiation exposure. yes  Counseling Included: Importance of adherence to annual lung cancer LDCT screening. yes Impact of comorbidities on ability to participate in the program. yes Ability and willingness to under diagnostic treatment. yes  Smoking Cessation Counseling: Current Smokers:  Discussed importance of smoking cessation. yes Information about tobacco cessation classes and interventions provided to patient. yes Patient provided with "ticket" for LDCT Scan. yes Symptomatic Patient. no  Counseling NA Diagnosis Code:  Tobacco Use Z72.0 Asymptomatic Patient yes  Counseling (Intermediate counseling: > three minutes counseling) U0454 Former Smokers:  Discussed the importance of maintaining cigarette abstinence. yes Diagnosis Code: Personal History of Nicotine Dependence. U98.119 Information about tobacco cessation classes and interventions provided to patient. Yes Patient provided with "ticket" for LDCT Scan. yes Written Order for Lung Cancer Screening with LDCT placed in Epic. Yes (CT Chest Lung Cancer Screening Low Dose W/O CM) [id number] Z12.2-Screening of respiratory organs Z87.891-Personal history of nicotine dependence  I have spent 25 minutes of face to face/ virtual visit   time with Mr. Baeten discussing the risks and benefits of lung cancer screening. We viewed / discussed a power point together that explained in detail the above noted topics. We paused at intervals to allow for questions to be asked and answered to ensure understanding.We discussed that the single most powerful action that he can take to decrease his risk of developing lung cancer is to quit smoking. We discussed whether or not he is ready to commit to setting a quit date. We discussed options for tools to aid in quitting smoking including nicotine replacement therapy, non-nicotine medications, support groups, Quit Smart classes, and behavior modification. We discussed that often times setting smaller, more achievable goals, such as eliminating 1 cigarette a day for a week and then 2 cigarettes a day for a week can be helpful in slowly decreasing the number of cigarettes smoked. This allows for a sense of accomplishment as well as providing a clinical benefit. I provided  him  with smoking cessation  information  with contact information for community resources, classes, free nicotine replacement therapy, and access to mobile apps, text messaging, and on-line smoking cessation help. I have also provided  him  the office contact  information in  the event he needs to contact me, or the screening staff. We discussed the time and location of the scan, and that either Abigail Miyamoto RN, Karlton Lemon, RN  or I will call / send a letter with the results within 24-72 hours of receiving them. The patient verbalized understanding of all of  the above and had no further questions upon leaving the office. They have my contact information in the event they have any further questions.  I spent 3 minutes counseling on smoking cessation and the health risks of continued tobacco abuse.  I explained to the patient that there has been a high incidence of coronary artery disease noted on these exams. I explained that this is a non-gated exam therefore degree or severity cannot be determined. This patient is on statin therapy. I have asked the patient to follow-up with their PCP regarding any incidental finding of coronary artery disease and management with diet or medication as their PCP  feels is clinically indicated. The patient verbalized understanding of the above and had no further questions upon completion of the visit.      Bevelyn Ngo, NP 04/26/2021

## 2021-04-27 ENCOUNTER — Ambulatory Visit
Admission: RE | Admit: 2021-04-27 | Discharge: 2021-04-27 | Disposition: A | Discharge status: Home / Self Care | Payer: Managed Care, Other (non HMO) | Source: Ambulatory Visit | Attending: Acute Care | Admitting: Acute Care

## 2021-04-27 ENCOUNTER — Other Ambulatory Visit: Payer: Self-pay

## 2021-04-27 DIAGNOSIS — Z87891 Personal history of nicotine dependence: Secondary | ICD-10-CM | POA: Insufficient documentation

## 2021-04-27 DIAGNOSIS — F17210 Nicotine dependence, cigarettes, uncomplicated: Secondary | ICD-10-CM | POA: Diagnosis present

## 2021-04-29 ENCOUNTER — Other Ambulatory Visit: Payer: Self-pay

## 2021-04-29 DIAGNOSIS — F17210 Nicotine dependence, cigarettes, uncomplicated: Secondary | ICD-10-CM

## 2021-04-29 DIAGNOSIS — Z87891 Personal history of nicotine dependence: Secondary | ICD-10-CM

## 2021-12-03 ENCOUNTER — Emergency Department: Payer: Managed Care, Other (non HMO)

## 2021-12-03 ENCOUNTER — Other Ambulatory Visit: Payer: Self-pay

## 2021-12-03 ENCOUNTER — Emergency Department
Admission: EM | Admit: 2021-12-03 | Discharge: 2021-12-03 | Discharge status: Against Medical Advice | Payer: Managed Care, Other (non HMO) | Attending: Emergency Medicine | Admitting: Emergency Medicine

## 2021-12-03 DIAGNOSIS — F17200 Nicotine dependence, unspecified, uncomplicated: Secondary | ICD-10-CM | POA: Insufficient documentation

## 2021-12-03 DIAGNOSIS — R0602 Shortness of breath: Secondary | ICD-10-CM | POA: Diagnosis not present

## 2021-12-03 DIAGNOSIS — R42 Dizziness and giddiness: Secondary | ICD-10-CM | POA: Insufficient documentation

## 2021-12-03 DIAGNOSIS — J449 Chronic obstructive pulmonary disease, unspecified: Secondary | ICD-10-CM | POA: Insufficient documentation

## 2021-12-03 DIAGNOSIS — Z5321 Procedure and treatment not carried out due to patient leaving prior to being seen by health care provider: Secondary | ICD-10-CM | POA: Diagnosis not present

## 2021-12-03 LAB — BASIC METABOLIC PANEL
Anion gap: 8 (ref 5–15)
BUN: 22 mg/dL — ABNORMAL HIGH (ref 6–20)
CO2: 22 mmol/L (ref 22–32)
Calcium: 10.1 mg/dL (ref 8.9–10.3)
Chloride: 110 mmol/L (ref 98–111)
Creatinine, Ser: 1.07 mg/dL (ref 0.61–1.24)
GFR, Estimated: >60 mL/min (ref >60)
Glucose, Bld: 102 mg/dL — ABNORMAL HIGH (ref 70–99)
Potassium: 3.1 mmol/L — ABNORMAL LOW (ref 3.5–5.1)
Sodium: 140 mmol/L (ref 135–145)

## 2021-12-03 LAB — TROPONIN I (HIGH SENSITIVITY): Troponin I (High Sensitivity): 6 ng/L (ref <18)

## 2021-12-03 LAB — CBC
HCT: 47.1 % (ref 39.0–52.0)
Hemoglobin: 16.5 g/dL (ref 13.0–17.0)
MCH: 31.1 pg (ref 26.0–34.0)
MCHC: 35 g/dL (ref 30.0–36.0)
MCV: 88.7 fL (ref 80.0–100.0)
Platelets: 292 10*3/uL (ref 150–400)
RBC: 5.31 MIL/uL (ref 4.22–5.81)
RDW: 12.6 % (ref 11.5–15.5)
WBC: 10.7 10*3/uL — ABNORMAL HIGH (ref 4.0–10.5)
nRBC: 0 % (ref 0.0–0.2)

## 2021-12-03 NOTE — ED Provider Triage Note (Signed)
Emergency Medicine Provider Triage Evaluation Note  [name] , a 56 y.o. male  was evaluated in triage.  Pt complains of shortness of breath and feeling "foggy." No chest pain or cardiac history. IPhone monitor has o2 sat 81-95% for the past several days.  Physical Exam  BP (!) 121/94   Pulse 83   Temp 98.2 F (36.8 C) (Oral)   Resp 16   Ht 5\' 10"  (1.778 m)   Wt 113.4 kg   SpO2 100%   BMI 35.87 kg/m  Gen:   Awake, no distress   Resp:  Normal effort  MSK:   Moves extremities without difficulty  Other:    Medical Decision Making  Medically screening exam initiated at 3:28 PM.  Appropriate orders placed.  [name] was informed that the remainder of the evaluation will be completed by another provider, this initial triage assessment does not replace that evaluation, and the importance of remaining in the ED until their evaluation is complete.   Jerrye Beavers, FNP 12/03/21 1529

## 2021-12-03 NOTE — ED Triage Notes (Signed)
Pt to ED for SOB and dizziness since yesterday. Denies CP. States felt "kind of foggy" today and yesterday.   Pt has COPD. Denies cardiac hx.  Pt states that yesterday his smart watch was giving readings of "81-94%" on RA. Pt is 100% on RA here. Pt is daily smoker.

## 2021-12-07 ENCOUNTER — Other Ambulatory Visit: Payer: Self-pay | Admitting: Internal Medicine

## 2021-12-07 DIAGNOSIS — R0609 Other forms of dyspnea: Secondary | ICD-10-CM

## 2021-12-14 ENCOUNTER — Ambulatory Visit: Payer: Managed Care, Other (non HMO) | Attending: Internal Medicine

## 2021-12-14 DIAGNOSIS — F17210 Nicotine dependence, cigarettes, uncomplicated: Secondary | ICD-10-CM | POA: Insufficient documentation

## 2021-12-14 DIAGNOSIS — R0609 Other forms of dyspnea: Secondary | ICD-10-CM | POA: Insufficient documentation

## 2021-12-14 DIAGNOSIS — J988 Other specified respiratory disorders: Secondary | ICD-10-CM | POA: Insufficient documentation

## 2021-12-14 LAB — PULMONARY FUNCTION TEST ARMC ONLY
DL/VA % pred: 90 %
DL/VA: 3.93 ml/min/mmHg/L
DLCO unc % pred: 93 %
DLCO unc: 26.92 ml/min/mmHg
FEF 25-75 Post: 4.6 L/sec
FEF 25-75 Pre: 3.5 L/sec
FEF2575-%Change-Post: 31 %
FEF2575-%Pred-Post: 140 %
FEF2575-%Pred-Pre: 106 %
FEV1-%Change-Post: 5 %
FEV1-%Pred-Post: 93 %
FEV1-%Pred-Pre: 88 %
FEV1-Post: 3.55 L
FEV1-Pre: 3.35 L
FEV1FVC-%Change-Post: 2 %
FEV1FVC-%Pred-Pre: 105 %
FEV6-%Change-Post: 2 %
FEV6-%Pred-Post: 89 %
FEV6-%Pred-Pre: 86 %
FEV6-Post: 4.25 L
FEV6-Pre: 4.13 L
FEV6FVC-%Change-Post: 0 %
FEV6FVC-%Pred-Post: 104 %
FEV6FVC-%Pred-Pre: 103 %
FVC-%Change-Post: 3 %
FVC-%Pred-Post: 86 %
FVC-%Pred-Pre: 83 %
FVC-Post: 4.26 L
FVC-Pre: 4.14 L
Post FEV1/FVC ratio: 83 %
Post FEV6/FVC ratio: 100 %
Pre FEV1/FVC ratio: 81 %
Pre FEV6/FVC Ratio: 100 %
RV % pred: 123 %
RV: 2.62 L
TLC % pred: 98 %
TLC: 6.85 L

## 2022-04-27 ENCOUNTER — Ambulatory Visit: Admission: RE | Admit: 2022-04-27 | Payer: Managed Care, Other (non HMO) | Source: Ambulatory Visit

## 2022-07-27 ENCOUNTER — Other Ambulatory Visit: Payer: Self-pay | Admitting: Physician Assistant

## 2022-07-27 ENCOUNTER — Ambulatory Visit
Admission: RE | Admit: 2022-07-27 | Discharge: 2022-07-27 | Disposition: A | Discharge status: Home / Self Care | Payer: Managed Care, Other (non HMO) | Source: Ambulatory Visit | Attending: Physician Assistant | Admitting: Physician Assistant

## 2022-07-27 DIAGNOSIS — N5082 Scrotal pain: Secondary | ICD-10-CM

## 2023-11-03 IMAGING — CT CT CHEST LUNG CANCER SCREENING LOW DOSE W/O CM
2 of 5 series · 15 of 40 positions shown, 18 images · non-contrast
Comparison: No priors.

CLINICAL DATA: 55-year-old male current smoker with 30 pack-year
history of smoking. Lung cancer screening examination.

EXAM:
CT CHEST WITHOUT CONTRAST LOW-DOSE FOR LUNG CANCER SCREENING
TECHNIQUE: Multidetector CT imaging of the chest was performed following the
standard protocol without IV contrast.

[Series 3: lung 1.00 · axial · 0.76mm/px · z∈[-1260,-926]mm · 12 of 368 slices shown, 15 images]
[im 17/368  mediastinal]
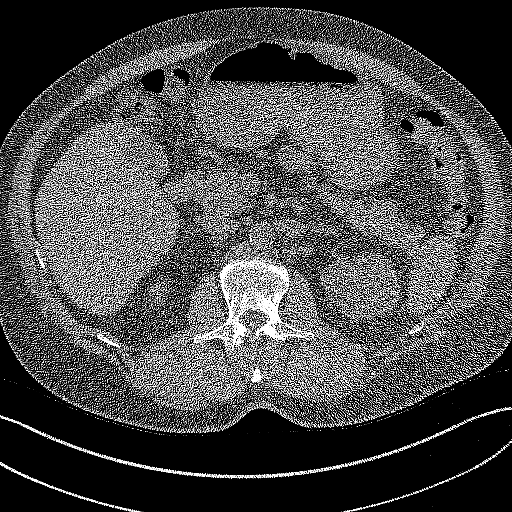
[im 17/368  lung]
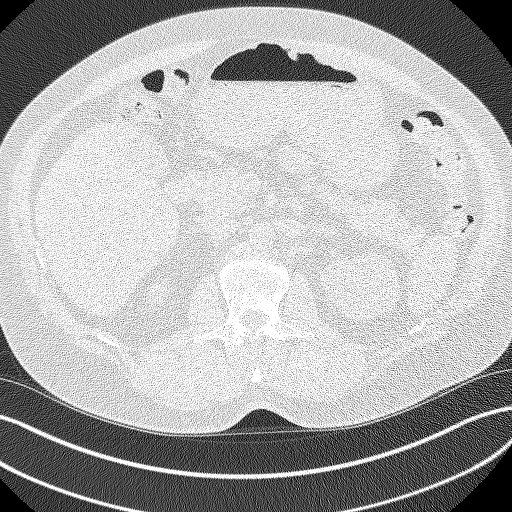
[im 51/368  lung]
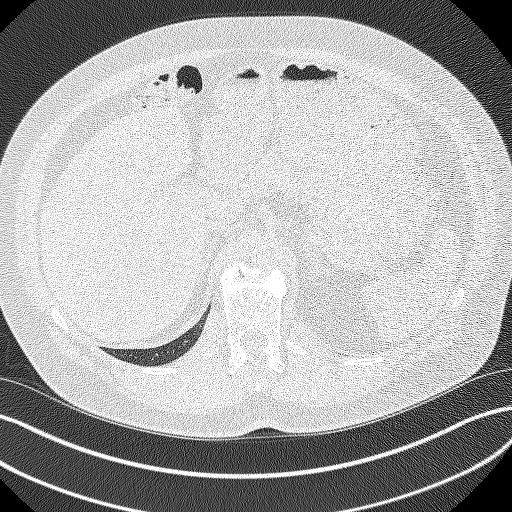
[im 84/368  lung]
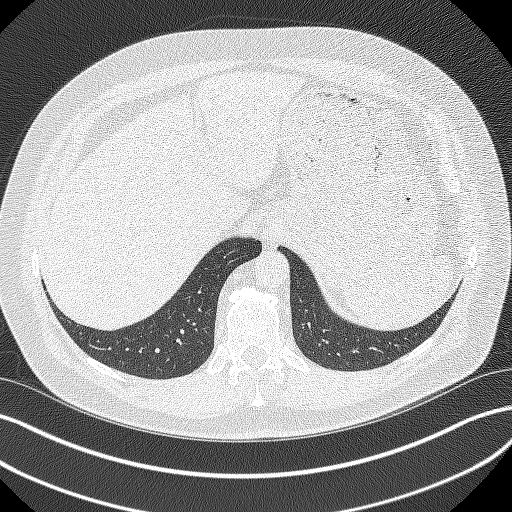
[im 117/368  lung]
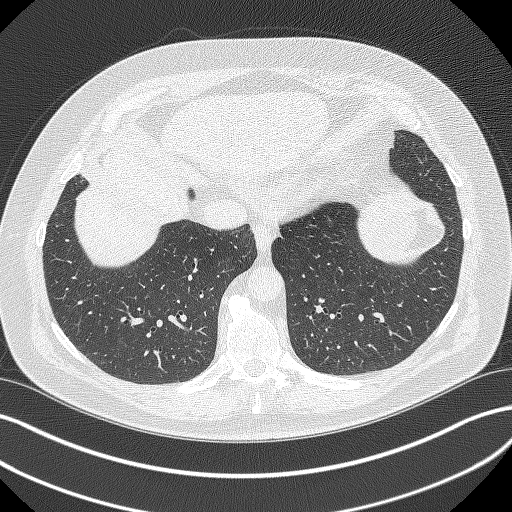
[im 134/368  mediastinal]
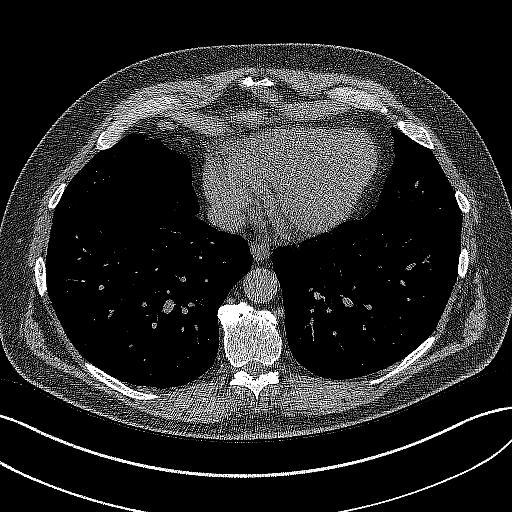
[im 134/368  lung]
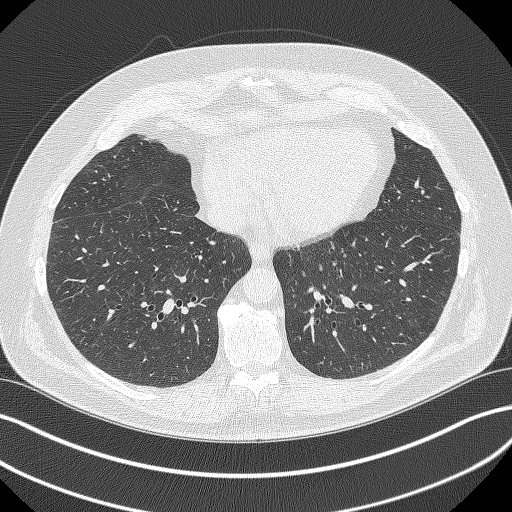
[im 167/368  lung]
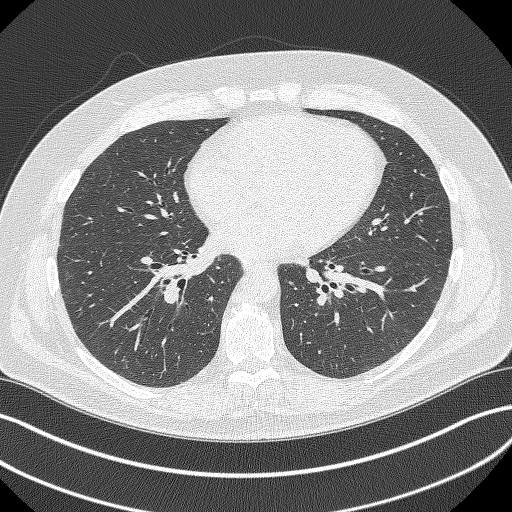
[im 201/368  lung]
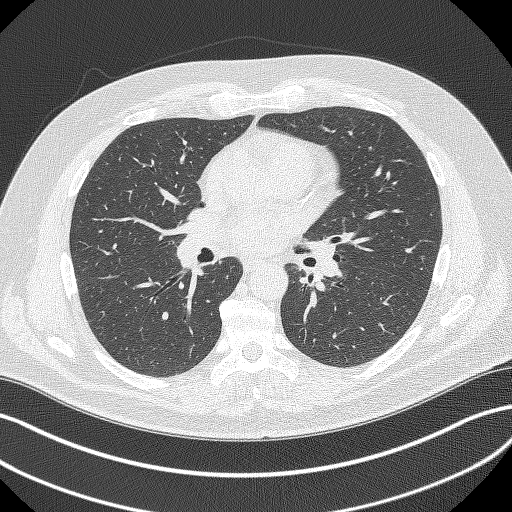
[im 234/368  lung]
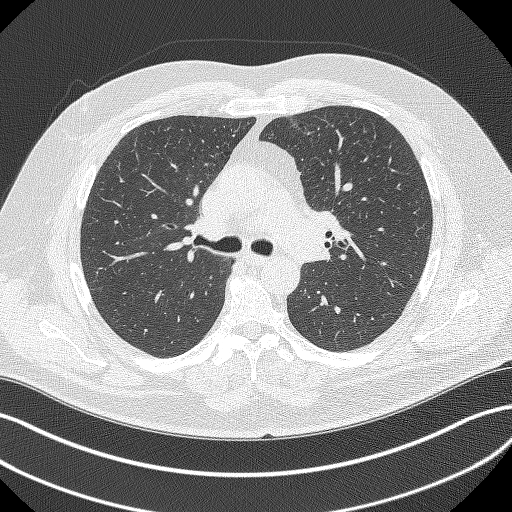
[im 251/368  mediastinal]
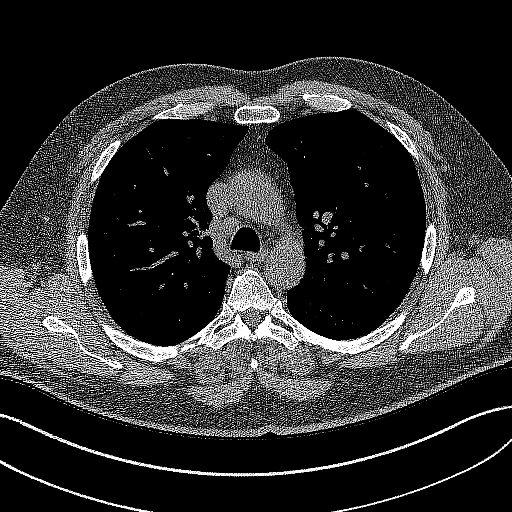
[im 251/368  lung]
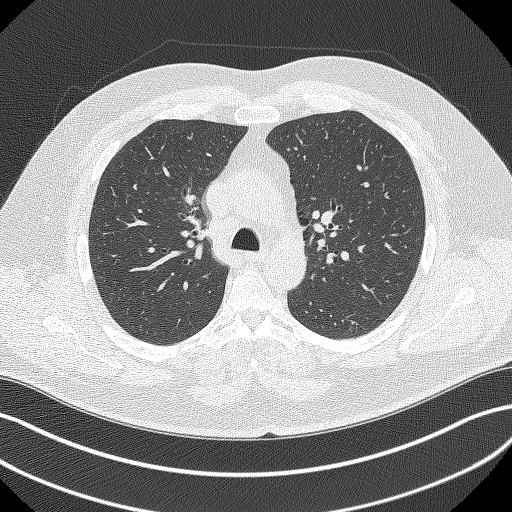
[im 284/368  lung]
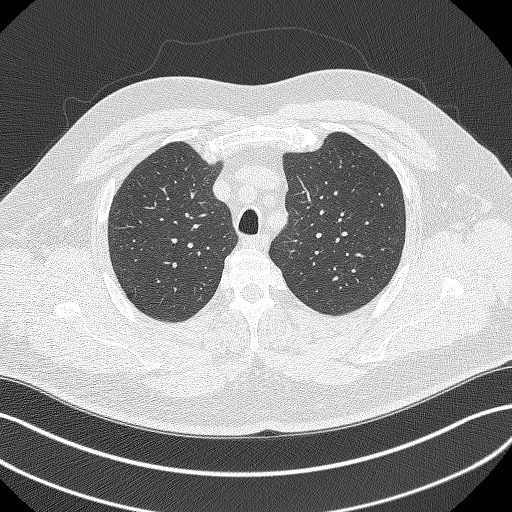
[im 317/368  lung]
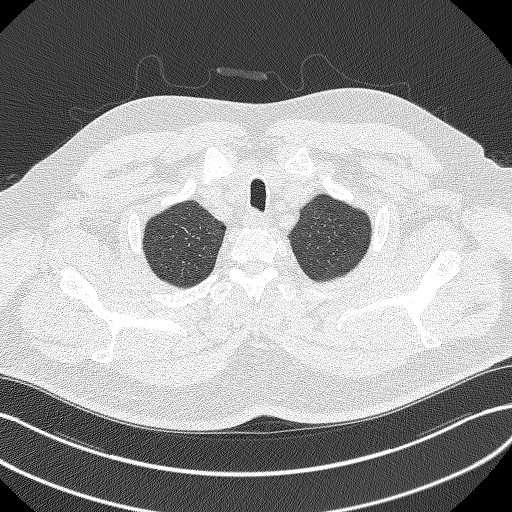
[im 351/368  lung]
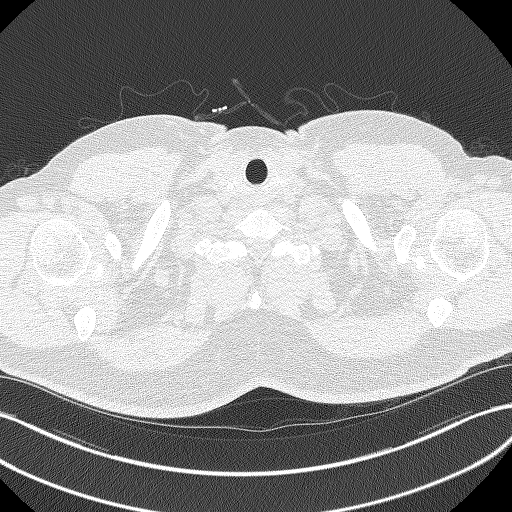

[Series 5: coronals lung 1.00 cor · coronal · 0.72mm/px · 3 of 314 slices shown]
[im 63/314  lung]
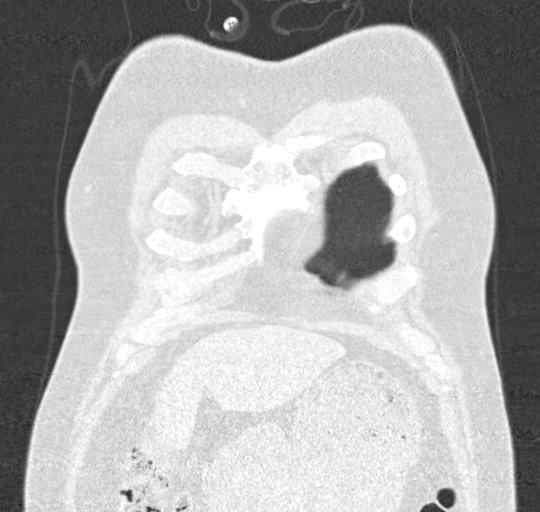
[im 126/314  lung]
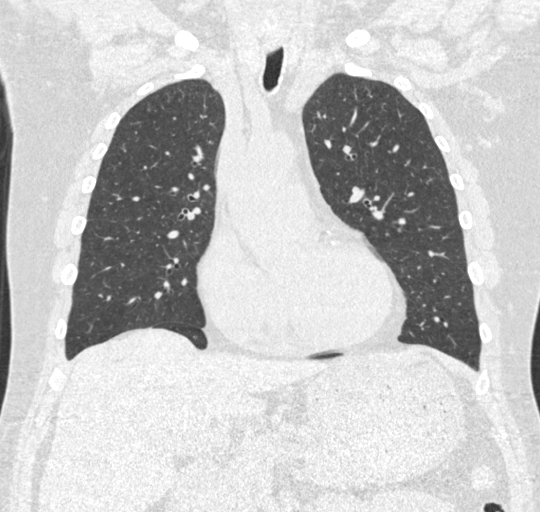
[im 188/314  lung]
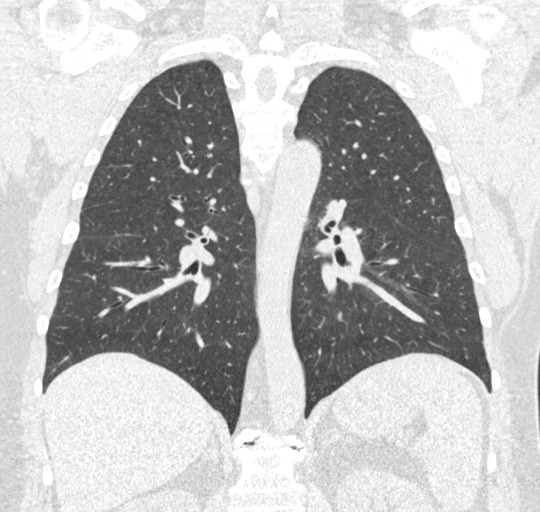

[15 of 40 positions shown; findings below may reference images not displayed]

FINDINGS: Cardiovascular: Heart size is normal. There is no significant
pericardial fluid, thickening or pericardial calcification. There is
aortic atherosclerosis, as well as atherosclerosis of the great
vessels of the mediastinum and the coronary arteries, including
calcified atherosclerotic plaque in the left main and left anterior
descending coronary arteries.

Mediastinum/Nodes: No pathologically enlarged mediastinal or hilar
lymph nodes. Please note that accurate exclusion of hilar adenopathy
is limited on noncontrast CT scans. Esophagus is unremarkable in
appearance. No axillary lymphadenopathy.

Lungs/Pleura: Small pulmonary nodules are noted in the lungs,
largest of which is in the left upper lobe (axial image 160 of
series 3), with a volume derived mean diameter of 3.8 mm. No larger
more suspicious appearing pulmonary nodules or masses are noted. No
acute consolidative airspace disease. No pleural effusions. Mild
diffuse bronchial wall thickening with mild centrilobular and
paraseptal emphysema.

Upper Abdomen: Aortic atherosclerosis.

Musculoskeletal: There are no aggressive appearing lytic or blastic
lesions noted in the visualized portions of the skeleton.
IMPRESSION: 1. Lung-RADS 2S, benign appearance or behavior. Continue annual
screening with low-dose chest CT without contrast in 12 months.
2. The "S" modifier above refers to potentially clinically
significant non lung cancer related findings. Specifically, there is
aortic atherosclerosis, in addition to left main and left anterior
descending coronary artery disease. Please note that although the
presence of coronary artery calcium documents the presence of
coronary artery disease, the severity of this disease and any
potential stenosis cannot be assessed on this non-gated CT
examination. Assessment for potential risk factor modification,
dietary therapy or pharmacologic therapy may be warranted, if
clinically indicated.
3. Mild diffuse bronchial wall thickening with mild centrilobular
and paraseptal emphysema; imaging findings suggestive of underlying
COPD.

Aortic Atherosclerosis (90V5D-GVY.Y) and Emphysema (90V5D-Q2E.7).
